# Patient Record
Sex: Female | Born: 1980 | Race: Black or African American | Hispanic: No | Marital: Married
Health system: Southern US, Community
[De-identification: ages and names within clinical notes are randomized; demographics above are authoritative.]

## PROBLEM LIST (undated history)

## (undated) DIAGNOSIS — I10 Essential (primary) hypertension: Secondary | ICD-10-CM

## (undated) DIAGNOSIS — F4310 Post-traumatic stress disorder, unspecified: Secondary | ICD-10-CM

---

## 2013-07-09 ENCOUNTER — Ambulatory Visit: Payer: Self-pay | Admitting: Adult Health

## 2013-10-08 ENCOUNTER — Ambulatory Visit: Payer: Self-pay

## 2013-10-08 LAB — URINALYSIS, COMPLETE
Bilirubin,UR: NEGATIVE
Glucose,UR: NEGATIVE
Ketone: NEGATIVE
Leukocyte Esterase: NEGATIVE
NITRITE: NEGATIVE
Ph: 7.5 (ref 5.0–8.0)
Protein: 30
Specific Gravity: 1.02 (ref 1.000–1.030)

## 2013-10-08 LAB — PREGNANCY, URINE: PREGNANCY TEST, URINE: NEGATIVE m[IU]/mL

## 2013-11-26 ENCOUNTER — Emergency Department: Payer: Self-pay | Admitting: Emergency Medicine

## 2013-11-26 LAB — COMPREHENSIVE METABOLIC PANEL
ALBUMIN: 3.2 g/dL — AB (ref 3.4–5.0)
ANION GAP: 5 — AB (ref 7–16)
Alkaline Phosphatase: 61 U/L
BUN: 8 mg/dL (ref 7–18)
Bilirubin,Total: 0.4 mg/dL (ref 0.2–1.0)
CO2: 28 mmol/L (ref 21–32)
Calcium, Total: 8.5 mg/dL (ref 8.5–10.1)
Chloride: 109 mmol/L — ABNORMAL HIGH (ref 98–107)
Creatinine: 0.58 mg/dL — ABNORMAL LOW (ref 0.60–1.30)
EGFR (African American): >60
GLUCOSE: 74 mg/dL (ref 65–99)
Osmolality: 280 (ref 275–301)
Potassium: 3.7 mmol/L (ref 3.5–5.1)
SGOT(AST): 14 U/L — ABNORMAL LOW (ref 15–37)
SGPT (ALT): 22 U/L
SODIUM: 142 mmol/L (ref 136–145)
Total Protein: 7.2 g/dL (ref 6.4–8.2)

## 2013-11-26 LAB — CBC WITH DIFFERENTIAL/PLATELET
BASOS PCT: 0.6 %
Basophil #: 0 10*3/uL (ref 0.0–0.1)
EOS PCT: 1.6 %
Eosinophil #: 0.1 10*3/uL (ref 0.0–0.7)
HCT: 39.5 % (ref 35.0–47.0)
HGB: 12.6 g/dL (ref 12.0–16.0)
LYMPHS ABS: 2.5 10*3/uL (ref 1.0–3.6)
Lymphocyte %: 32.8 %
MCH: 28.1 pg (ref 26.0–34.0)
MCHC: 31.8 g/dL — ABNORMAL LOW (ref 32.0–36.0)
MCV: 88 fL (ref 80–100)
Monocyte #: 0.4 x10 3/mm (ref 0.2–0.9)
Monocyte %: 5.6 %
Neutrophil #: 4.5 10*3/uL (ref 1.4–6.5)
Neutrophil %: 59.4 %
Platelet: 192 10*3/uL (ref 150–440)
RBC: 4.48 10*6/uL (ref 3.80–5.20)
RDW: 14.4 % (ref 11.5–14.5)
WBC: 7.6 10*3/uL (ref 3.6–11.0)

## 2013-11-26 LAB — URINALYSIS, COMPLETE
BILIRUBIN, UR: NEGATIVE
Bacteria: NONE SEEN
GLUCOSE, UR: NEGATIVE mg/dL (ref 0–75)
Ketone: NEGATIVE
LEUKOCYTE ESTERASE: NEGATIVE
Nitrite: NEGATIVE
PH: 6 (ref 4.5–8.0)
PROTEIN: NEGATIVE
RBC,UR: NONE SEEN /HPF (ref 0–5)
Specific Gravity: 1.006 (ref 1.003–1.030)
WBC UR: 6 /HPF (ref 0–5)

## 2013-11-26 LAB — TSH: Thyroid Stimulating Horm: 0.958 u[IU]/mL

## 2013-11-26 LAB — PREGNANCY, URINE: PREGNANCY TEST, URINE: NEGATIVE m[IU]/mL

## 2014-02-02 ENCOUNTER — Emergency Department: Payer: Self-pay | Admitting: Emergency Medicine

## 2014-02-02 LAB — CBC WITH DIFFERENTIAL/PLATELET
Basophil #: 0.1 10*3/uL (ref 0.0–0.1)
Basophil %: 0.8 %
EOS ABS: 0.1 10*3/uL (ref 0.0–0.7)
EOS PCT: 1 %
HCT: 44 % (ref 35.0–47.0)
HGB: 14.6 g/dL (ref 12.0–16.0)
Lymphocyte #: 2.2 10*3/uL (ref 1.0–3.6)
Lymphocyte %: 22.3 %
MCH: 28.9 pg (ref 26.0–34.0)
MCHC: 33.2 g/dL (ref 32.0–36.0)
MCV: 87 fL (ref 80–100)
MONOS PCT: 5.9 %
Monocyte #: 0.6 x10 3/mm (ref 0.2–0.9)
NEUTROS ABS: 7 10*3/uL — AB (ref 1.4–6.5)
Neutrophil %: 70 %
Platelet: 232 10*3/uL (ref 150–440)
RBC: 5.07 10*6/uL (ref 3.80–5.20)
RDW: 13.9 % (ref 11.5–14.5)
WBC: 9.9 10*3/uL (ref 3.6–11.0)

## 2014-02-02 LAB — BASIC METABOLIC PANEL
ANION GAP: 9 (ref 7–16)
BUN: 8 mg/dL (ref 7–18)
Calcium, Total: 8.4 mg/dL — ABNORMAL LOW (ref 8.5–10.1)
Chloride: 103 mmol/L (ref 98–107)
Co2: 27 mmol/L (ref 21–32)
Creatinine: 0.71 mg/dL (ref 0.60–1.30)
EGFR (Non-African Amer.): >60
GLUCOSE: 95 mg/dL (ref 65–99)
Osmolality: 276 (ref 275–301)
Potassium: 3.3 mmol/L — ABNORMAL LOW (ref 3.5–5.1)
Sodium: 139 mmol/L (ref 136–145)

## 2014-02-02 LAB — URINALYSIS, COMPLETE
Bilirubin,UR: NEGATIVE
Glucose,UR: NEGATIVE mg/dL
Ketone: NEGATIVE
Nitrite: NEGATIVE
Ph: 7
Protein: NEGATIVE
RBC,UR: 2 /HPF
Specific Gravity: 1.01
Squamous Epithelial: 4
WBC UR: 2 /HPF

## 2014-02-02 LAB — TSH: Thyroid Stimulating Horm: 0.863 u[IU]/mL

## 2014-04-13 ENCOUNTER — Emergency Department (HOSPITAL_COMMUNITY)
Admission: EM | Admit: 2014-04-13 | Discharge: 2014-04-13 | Disposition: A | Discharge status: Home / Self Care | Payer: Self-pay | Attending: Emergency Medicine | Admitting: Emergency Medicine

## 2014-04-13 ENCOUNTER — Emergency Department (HOSPITAL_COMMUNITY): Payer: Self-pay

## 2014-04-13 ENCOUNTER — Encounter (HOSPITAL_COMMUNITY): Payer: Self-pay | Admitting: Adult Health

## 2014-04-13 DIAGNOSIS — B372 Candidiasis of skin and nail: Secondary | ICD-10-CM | POA: Insufficient documentation

## 2014-04-13 DIAGNOSIS — N898 Other specified noninflammatory disorders of vagina: Secondary | ICD-10-CM | POA: Insufficient documentation

## 2014-04-13 DIAGNOSIS — I10 Essential (primary) hypertension: Secondary | ICD-10-CM | POA: Insufficient documentation

## 2014-04-13 DIAGNOSIS — E876 Hypokalemia: Secondary | ICD-10-CM | POA: Insufficient documentation

## 2014-04-13 DIAGNOSIS — Z3202 Encounter for pregnancy test, result negative: Secondary | ICD-10-CM | POA: Insufficient documentation

## 2014-04-13 DIAGNOSIS — K5900 Constipation, unspecified: Secondary | ICD-10-CM | POA: Insufficient documentation

## 2014-04-13 DIAGNOSIS — R51 Headache: Secondary | ICD-10-CM | POA: Insufficient documentation

## 2014-04-13 DIAGNOSIS — Z8659 Personal history of other mental and behavioral disorders: Secondary | ICD-10-CM | POA: Insufficient documentation

## 2014-04-13 DIAGNOSIS — N390 Urinary tract infection, site not specified: Secondary | ICD-10-CM | POA: Insufficient documentation

## 2014-04-13 DIAGNOSIS — R519 Headache, unspecified: Secondary | ICD-10-CM

## 2014-04-13 HISTORY — DX: Post-traumatic stress disorder, unspecified: F43.10

## 2014-04-13 HISTORY — DX: Essential (primary) hypertension: I10

## 2014-04-13 LAB — CBC WITH DIFFERENTIAL/PLATELET
Basophils Absolute: 0 10*3/uL (ref 0.0–0.1)
Basophils Relative: 0 % (ref 0–1)
Eosinophils Absolute: 0.1 10*3/uL (ref 0.0–0.7)
Eosinophils Relative: 1 % (ref 0–5)
HCT: 39.3 % (ref 36.0–46.0)
Hemoglobin: 13.5 g/dL (ref 12.0–15.0)
LYMPHS PCT: 35 % (ref 12–46)
Lymphs Abs: 3.3 10*3/uL (ref 0.7–4.0)
MCH: 28.5 pg (ref 26.0–34.0)
MCHC: 34.4 g/dL (ref 30.0–36.0)
MCV: 83.1 fL (ref 78.0–100.0)
Monocytes Absolute: 0.6 10*3/uL (ref 0.1–1.0)
Monocytes Relative: 7 % (ref 3–12)
NEUTROS PCT: 57 % (ref 43–77)
Neutro Abs: 5.3 10*3/uL (ref 1.7–7.7)
PLATELETS: 213 10*3/uL (ref 150–400)
RBC: 4.73 MIL/uL (ref 3.87–5.11)
RDW: 13.7 % (ref 11.5–15.5)
WBC: 9.4 10*3/uL (ref 4.0–10.5)

## 2014-04-13 LAB — COMPREHENSIVE METABOLIC PANEL
ALT: 16 U/L (ref 0–35)
ANION GAP: 8 (ref 5–15)
AST: 19 U/L (ref 0–37)
Albumin: 3.4 g/dL — ABNORMAL LOW (ref 3.5–5.2)
Alkaline Phosphatase: 61 U/L (ref 39–117)
BUN: 7 mg/dL (ref 6–23)
CO2: 27 mmol/L (ref 19–32)
Calcium: 8.7 mg/dL (ref 8.4–10.5)
Chloride: 102 mmol/L (ref 96–112)
Creatinine, Ser: 0.63 mg/dL (ref 0.50–1.10)
GFR calc Af Amer: >90 mL/min (ref >90)
Glucose, Bld: 105 mg/dL — ABNORMAL HIGH (ref 70–99)
Potassium: 2.8 mmol/L — ABNORMAL LOW (ref 3.5–5.1)
SODIUM: 137 mmol/L (ref 135–145)
Total Bilirubin: 0.7 mg/dL (ref 0.3–1.2)
Total Protein: 6.8 g/dL (ref 6.0–8.3)

## 2014-04-13 LAB — URINALYSIS, ROUTINE W REFLEX MICROSCOPIC
Bilirubin Urine: NEGATIVE
GLUCOSE, UA: NEGATIVE mg/dL
Ketones, ur: NEGATIVE mg/dL
LEUKOCYTES UA: NEGATIVE
Nitrite: NEGATIVE
PH: 7 (ref 5.0–8.0)
Protein, ur: NEGATIVE mg/dL
Specific Gravity, Urine: 1.016 (ref 1.005–1.030)
Urobilinogen, UA: 1 mg/dL (ref 0.0–1.0)

## 2014-04-13 LAB — URINE MICROSCOPIC-ADD ON

## 2014-04-13 LAB — LIPASE, BLOOD: LIPASE: 27 U/L (ref 11–59)

## 2014-04-13 LAB — WET PREP, GENITAL
Trich, Wet Prep: NONE SEEN
Yeast Wet Prep HPF POC: NONE SEEN

## 2014-04-13 LAB — POC URINE PREG, ED: Preg Test, Ur: NEGATIVE

## 2014-04-13 MED ORDER — HYDROCORTISONE 1 % EX CREA: TOPICAL_CREAM | CUTANEOUS | Status: AC

## 2014-04-13 MED ORDER — KETOROLAC TROMETHAMINE 30 MG/ML IJ SOLN: 30.0000 mg | Freq: Once | INTRAMUSCULAR | Status: DC

## 2014-04-13 MED ORDER — CLOTRIMAZOLE 1 % EX CREA: TOPICAL_CREAM | CUTANEOUS | Status: AC

## 2014-04-13 MED ORDER — DIPHENHYDRAMINE HCL 50 MG/ML IJ SOLN: 25.0000 mg | Freq: Once | INTRAMUSCULAR | Status: DC

## 2014-04-13 MED ORDER — METOCLOPRAMIDE HCL 5 MG/ML IJ SOLN: 10.0000 mg | Freq: Once | INTRAMUSCULAR | Status: DC

## 2014-04-13 MED ORDER — POLYETHYLENE GLYCOL 3350 17 G PO PACK: 17.0000 g | PACK | Freq: Every day | ORAL | Status: AC

## 2014-04-13 MED ORDER — SODIUM CHLORIDE 0.9 % IV BOLUS (SEPSIS): 1000.0000 mL | Freq: Once | INTRAVENOUS | Status: DC

## 2014-04-13 MED ORDER — DICYCLOMINE HCL 20 MG PO TABS: 20.0000 mg | ORAL_TABLET | Freq: Two times a day (BID) | ORAL | Status: AC

## 2014-04-13 MED ORDER — METOCLOPRAMIDE HCL 10 MG PO TABS
10.0000 mg | ORAL_TABLET | Freq: Once | ORAL | Status: DC
  Filled 2014-04-13: qty 1

## 2014-04-13 MED ORDER — ACETAMINOPHEN 500 MG PO TABS
1000.0000 mg | ORAL_TABLET | Freq: Once | ORAL | Status: DC
  Filled 2014-04-13: qty 2

## 2014-04-13 MED ORDER — POTASSIUM CHLORIDE CRYS ER 20 MEQ PO TBCR
40.0000 meq | EXTENDED_RELEASE_TABLET | Freq: Once | ORAL | Status: AC
  Administered 2014-04-13: 40 meq via ORAL
  Filled 2014-04-13: qty 2

## 2014-04-13 MED ORDER — CEPHALEXIN 500 MG PO CAPS: 500.0000 mg | ORAL_CAPSULE | Freq: Three times a day (TID) | ORAL | Status: AC

## 2014-04-13 MED ORDER — DIPHENHYDRAMINE HCL 25 MG PO CAPS
25.0000 mg | ORAL_CAPSULE | Freq: Once | ORAL | Status: DC
  Filled 2014-04-13: qty 1

## 2014-04-13 NOTE — ED Notes (Signed)
Pt updated on wait time.  

## 2014-04-13 NOTE — ED Provider Notes (Signed)
CSN: [id number]     Arrival date & time 04/13/14  1559 History   First MD Initiated Contact with Patient 04/13/14 1921     Chief Complaint  Patient presents with  . Headache  . Abdominal Pain   HPI  Patient is a 34 year old female with past medical history hypertension who presents emergency room for evaluation of multiple complaints. Complaint #1 is that she has chronic headaches. Patient states that she intermittently experiences frontal throbbing headaches which feel like her head is in a vice grip. She is unaware of any aggravating factors. They normally go away with some anti-inflammatory use. She sometimes gets off before her eyes. There is a strong family history of migraines in her mother and her sister. Complaint #2 is that she is having generalized abdominal pain. Patient states that she has constipation and sometimes goes as infrequently as once per 2 weeks. Patient has tried stool softeners at home with little relief and also Gas-X. She also complains of foul smelling gas and some crampy feelings. Her last bowel movement was 4 days ago. She has the urge to defecate but cannot go. Patient states there is a strong family history of IBS as well. Patient also concern for possible bacterial vaginosis. She states that she has had some vaginal discharge and dysuria. She states that she frequently has this problem. Patient's last concern is that she has a rash on her chest. The rash is underneath her breasts. She states that is burning. She states she has been swimming frequently. She is requesting that her thyroid and her HIV be tested today.  Past Medical History  Diagnosis Date  . PTSD (post-traumatic stress disorder)   . Hypertension    Past Surgical History  Procedure Laterality Date  . Cesarean section     History reviewed. No pertinent family history. History  Substance Use Topics  . Smoking status: Not on file  . Smokeless tobacco: Not on file  . Alcohol Use: No   OB History     No data available     Review of Systems  Constitutional: Negative for fever, chills and fatigue.  Respiratory: Negative for chest tightness and shortness of breath.   Cardiovascular: Negative for chest pain and palpitations.  Gastrointestinal: Positive for abdominal pain and constipation. Negative for nausea, vomiting, diarrhea and abdominal distention.  Genitourinary: Positive for dysuria and vaginal discharge. Negative for urgency, frequency, hematuria, decreased urine volume, vaginal bleeding, difficulty urinating and vaginal pain.  Musculoskeletal: Negative for neck pain and neck stiffness.  Neurological: Positive for headaches. Negative for dizziness, syncope, speech difficulty, weakness and numbness.  All other systems reviewed and are negative.     Allergies  Other  Home Medications   Prior to Admission medications   Medication Sig Start Date End Date Taking? Authorizing Provider  cephALEXin (KEFLEX) 500 MG capsule Take 1 capsule (500 mg total) by mouth 3 (three) times daily. 04/13/14   Denni France A Forcucci, PA-C  clotrimazole (LOTRIMIN) 1 % cream Apply to affected area 2 times daily 04/13/14   Adolpho Meenach A Forcucci, PA-C  dicyclomine (BENTYL) 20 MG tablet Take 1 tablet (20 mg total) by mouth 2 (two) times daily. 04/13/14   Lealer Marsland A Forcucci, PA-C  hydrocortisone cream 1 % Apply to affected area 2 times daily 04/13/14   Ruthella Kirchman A Forcucci, PA-C  polyethylene glycol (MIRALAX / GLYCOLAX) packet Take 17 g by mouth daily. 04/13/14   Kinda Pottle A Forcucci, PA-C   BP 114/64 mmHg  Pulse 79  Temp(Src)  98.2 F (36.8 C) (Oral)  Resp 18  Ht  (1.651 m)  Wt 200 lb (90.719 kg)  BMI 33.28 kg/m2  SpO2 100%  LMP 02/26/2014 (Approximate) Physical Exam  Constitutional: She is oriented to person, place, and time. She appears well-developed and well-nourished. No distress.  HENT:  Head: Normocephalic and atraumatic.  Mouth/Throat: Oropharynx is clear and moist. No oropharyngeal exudate.   Eyes: Conjunctivae and EOM are normal. Pupils are equal, round, and reactive to light. No scleral icterus.  Neck: Normal range of motion. Neck supple. No JVD present. No Brudzinski's sign and no Kernig's sign noted. No thyromegaly present.  Cardiovascular: Normal rate, regular rhythm and intact distal pulses.  Exam reveals no gallop and no friction rub.   No murmur heard. Pulmonary/Chest: Effort normal and breath sounds normal. No respiratory distress. She has no wheezes. She has no rales. She exhibits no tenderness.  Abdominal: Soft. Normal appearance and bowel sounds are normal. She exhibits no distension and no mass. There is generalized tenderness. There is no rigidity, no rebound, no guarding, no tenderness at McBurney's point and negative Murphy's sign.  Musculoskeletal: Normal range of motion.  Lymphadenopathy:    She has no cervical adenopathy.  Neurological: She is alert and oriented to person, place, and time.  Skin: Skin is warm and dry. She is not diaphoretic.  Psychiatric: She has a normal mood and affect. Her behavior is normal. Judgment and thought content normal.  Nursing note and vitals reviewed.   ED Course  Procedures (including critical care time) Labs Review Labs Reviewed  WET PREP, GENITAL - Abnormal; Notable for the following:    Clue Cells Wet Prep HPF POC FEW (*)    WBC, Wet Prep HPF POC FEW (*)    All other components within normal limits  COMPREHENSIVE METABOLIC PANEL - Abnormal; Notable for the following:    Potassium 2.8 (*)    Glucose, Bld 105 (*)    Albumin 3.4 (*)    All other components within normal limits  URINALYSIS, ROUTINE W REFLEX MICROSCOPIC - Abnormal; Notable for the following:    APPearance CLOUDY (*)    Hgb urine dipstick LARGE (*)    All other components within normal limits  URINE MICROSCOPIC-ADD ON - Abnormal; Notable for the following:    Squamous Epithelial / LPF FEW (*)    Bacteria, UA MANY (*)    All other components within  normal limits  URINE CULTURE  CBC WITH DIFFERENTIAL/PLATELET  LIPASE, BLOOD  HIV ANTIBODY (ROUTINE TESTING)  POC URINE PREG, ED  GC/CHLAMYDIA PROBE AMP ([place])    Imaging Review Dg Abd 1 View  04/13/2014   CLINICAL DATA:  Abdominal pain, worse with urinating or bowel movements for 2 days.  EXAM: ABDOMEN - 1 VIEW  COMPARISON:  None.  FINDINGS: The bowel gas pattern is normal. No radio-opaque calculi or other significant radiographic abnormality are seen. IUD is noted.  IMPRESSION: No acute finding.   Electronically Signed   By: Drusilla Kanner M.D.   On: 04/13/2014 21:22     EKG Interpretation None      MDM   Final diagnoses:  UTI (lower urinary tract infection)  Constipation, unspecified constipation type  Nonintractable headache, unspecified chronicity pattern, unspecified headache type  Candidal skin infection   Patient is a 34 year old female presents emergency room for evaluation of multiple complaints. Physical exam is unremarkable. Abdominal is generalized tender with no rigidity, guarding, or peritoneal signs. Patient has a normal neurological exam.  Patient treated here with migraine cocktail orally. Basic lab work is unremarkable other than mild hypokalemia which has been replaced orally here. Urinalysis is leukocyte and nitrite negative does appear to have red blood cells and large bacteria. Prep shows few clue cells but no frank bacterial vaginosis. CBC, lipase are unremarkable. GC is pending. Urine pregnancy is negative. We'll refer the patient to neurology for further care and trying to prevent headaches. We'll discharge the patient home on MiraLAX for constipation. This is a clinical diagnosis of abdominal x-ray is unremarkable. We'll discharge home on Keflex for possible urinary tract infection. We'll send urine for culture. Patient to return for difficulty urinating, intractable abdominal pain, or any other concerning symptoms. Patient states understanding and  agreement at this time. We'll refer the patient to the [facility] and will also give the resource list. She does need to set up with a primary care provider. HIV is pending. Patient states understanding and agreement at this time. Patient stable for discharge.    Eben Burow, PA-C 04/13/14 2310  Geoffery Lyons, MD 04/17/14 [phone]

## 2014-04-13 NOTE — Discharge Instructions (Signed)
Headaches, Frequently Asked Questions °MIGRAINE HEADACHES °Q: What is migraine? What causes it? How can I treat it? °A: Generally, migraine headaches begin as a dull ache. Then they develop into a constant, throbbing, and pulsating pain. You may experience pain at the temples. You may experience pain at the front or back of one or both sides of the head. The pain is usually accompanied by a combination of: °· Nausea. °· Vomiting. °· Sensitivity to light and noise. °Some people (about 15%) experience an aura (see below) before an attack. The cause of migraine is believed to be chemical reactions in the brain. Treatment for migraine may include over-the-counter or prescription medications. It may also include self-help techniques. These include relaxation training and biofeedback.  °Q: What is an aura? °A: About 15% of people with migraine get an "aura". This is a sign of neurological symptoms that occur before a migraine headache. You may see wavy or jagged lines, dots, or flashing lights. You might experience tunnel vision or blind spots in one or both eyes. The aura can include visual or auditory hallucinations (something imagined). It may include disruptions in smell (such as strange odors), taste or touch. Other symptoms include: °· Numbness. °· A "pins and needles" sensation. °· Difficulty in recalling or speaking the correct word. °These neurological events may last as long as 60 minutes. These symptoms will fade as the headache begins. °Q: What is a trigger? °A: Certain physical or environmental factors can lead to or "trigger" a migraine. These include: °· Foods. °· Hormonal changes. °· Weather. °· Stress. °It is important to remember that triggers are different for everyone. To help prevent migraine attacks, you need to figure out which triggers affect you. Keep a headache diary. This is a good way to track triggers. The diary will help you talk to your healthcare professional about your condition. °Q: Does  weather affect migraines? °A: Bright sunshine, hot, humid conditions, and drastic changes in barometric pressure may lead to, or "trigger," a migraine attack in some people. But studies have shown that weather does not act as a trigger for everyone with migraines. °Q: What is the link between migraine and hormones? °A: Hormones start and regulate many of your body's functions. Hormones keep your body in balance within a constantly changing environment. The levels of hormones in your body are unbalanced at times. Examples are during menstruation, pregnancy, or menopause. That can lead to a migraine attack. In fact, about three quarters of all women with migraine report that their attacks are related to the menstrual cycle.  °Q: Is there an increased risk of stroke for migraine sufferers? °A: The likelihood of a migraine attack causing a stroke is very remote. That is not to say that migraine sufferers cannot have a stroke associated with their migraines. In persons under age 40, the most common associated factor for stroke is migraine headache. But over the course of a person's normal life span, the occurrence of migraine headache may actually be associated with a reduced risk of dying from cerebrovascular disease due to stroke.  °Q: What are acute medications for migraine? °A: Acute medications are used to treat the pain of the headache after it has started. Examples over-the-counter medications, NSAIDs, ergots, and triptans.  °Q: What are the triptans? °A: Triptans are the newest class of abortive medications. They are specifically targeted to treat migraine. Triptans are vasoconstrictors. They moderate some chemical reactions in the brain. The triptans work on receptors in your brain. Triptans help   to restore the balance of a neurotransmitter called serotonin. Fluctuations in levels of serotonin are thought to be a main cause of migraine.  °Q: Are over-the-counter medications for migraine effective? °A:  Over-the-counter, or "OTC," medications may be effective in relieving mild to moderate pain and associated symptoms of migraine. But you should see your caregiver before beginning any treatment regimen for migraine.  °Q: What are preventive medications for migraine? °A: Preventive medications for migraine are sometimes referred to as "prophylactic" treatments. They are used to reduce the frequency, severity, and length of migraine attacks. Examples of preventive medications include antiepileptic medications, antidepressants, beta-blockers, calcium channel blockers, and NSAIDs (nonsteroidal anti-inflammatory drugs). °Q: Why are anticonvulsants used to treat migraine? °A: During the past few years, there has been an increased interest in antiepileptic drugs for the prevention of migraine. They are sometimes referred to as "anticonvulsants". Both epilepsy and migraine may be caused by similar reactions in the brain.  °Q: Why are antidepressants used to treat migraine? °A: Antidepressants are typically used to treat people with depression. They may reduce migraine frequency by regulating chemical levels, such as serotonin, in the brain.  °Q: What alternative therapies are used to treat migraine? °A: The term "alternative therapies" is often used to describe treatments considered outside the scope of conventional Western medicine. Examples of alternative therapy include acupuncture, acupressure, and yoga. Another common alternative treatment is herbal therapy. Some herbs are believed to relieve headache pain. Always discuss alternative therapies with your caregiver before proceeding. Some herbal products contain arsenic and other toxins. °TENSION HEADACHES °Q: What is a tension-type headache? What causes it? How can I treat it? °A: Tension-type headaches occur randomly. They are often the result of temporary stress, anxiety, fatigue, or anger. Symptoms include soreness in your temples, a tightening band-like sensation  around your head (a "vice-like" ache). Symptoms can also include a pulling feeling, pressure sensations, and contracting head and neck muscles. The headache begins in your forehead, temples, or the back of your head and neck. Treatment for tension-type headache may include over-the-counter or prescription medications. Treatment may also include self-help techniques such as relaxation training and biofeedback. °CLUSTER HEADACHES °Q: What is a cluster headache? What causes it? How can I treat it? °A: Cluster headache gets its name because the attacks come in groups. The pain arrives with little, if any, warning. It is usually on one side of the head. A tearing or bloodshot eye and a runny nose on the same side of the headache may also accompany the pain. Cluster headaches are believed to be caused by chemical reactions in the brain. They have been described as the most severe and intense of any headache type. Treatment for cluster headache includes prescription medication and oxygen. °SINUS HEADACHES °Q: What is a sinus headache? What causes it? How can I treat it? °A: When a cavity in the bones of the face and skull (a sinus) becomes inflamed, the inflammation will cause localized pain. This condition is usually the result of an allergic reaction, a tumor, or an infection. If your headache is caused by a sinus blockage, such as an infection, you will probably have a fever. An x-ray will confirm a sinus blockage. Your caregiver's treatment might include antibiotics for the infection, as well as antihistamines or decongestants.  °REBOUND HEADACHES °Q: What is a rebound headache? What causes it? How can I treat it? °A: A pattern of taking acute headache medications too often can lead to a condition known as "rebound headache."   A pattern of taking too much headache medication includes taking it more than 2 days per week or in excessive amounts. That means more than the label or a caregiver advises. With rebound  headaches, your medications not only stop relieving pain, they actually begin to cause headaches. Doctors treat rebound headache by tapering the medication that is being overused. Sometimes your caregiver will gradually substitute a different type of treatment or medication. Stopping may be a challenge. Regularly overusing a medication increases the potential for serious side effects. Consult a caregiver if you regularly use headache medications more than 2 days per week or more than the label advises. ADDITIONAL QUESTIONS AND ANSWERS Q: What is biofeedback? A: Biofeedback is a self-help treatment. Biofeedback uses special equipment to monitor your body's involuntary physical responses. Biofeedback monitors:  Breathing.  Pulse.  Heart rate.  Temperature.  Muscle tension.  Brain activity. Biofeedback helps you refine and perfect your relaxation exercises. You learn to control the physical responses that are related to stress. Once the technique has been mastered, you do not need the equipment any more. Q: Are headaches hereditary? A: Four out of five (80%) of people that suffer report a family history of migraine. Scientists are not sure if this is genetic or a family predisposition. Despite the uncertainty, a child has a 50% chance of having migraine if one parent suffers. The child has a 75% chance if both parents suffer.  Q: Can children get headaches? A: By the time they reach high school, most young people have experienced some type of headache. Many safe and effective approaches or medications can prevent a headache from occurring or stop it after it has begun.  Q: What type of doctor should I see to diagnose and treat my headache? A: Start with your primary caregiver. Discuss his or her experience and approach to headaches. Discuss methods of classification, diagnosis, and treatment. Your caregiver may decide to recommend you to a headache specialist, depending upon your symptoms or other  physical conditions. Having diabetes, allergies, etc., may require a more comprehensive and inclusive approach to your headache. The National Headache Foundation will provide, upon request, a list of [facility] physician members in your state. Document Released: 05/05/2003 Document Revised: 05/07/2011 Document Reviewed: 10/13/2007 [facility] Patient Information 2015 [place]. This information is not intended to replace advice given to you by your health care provider. Make sure you discuss any questions you have with your health care provider.  Constipation Constipation is when a person has fewer than three bowel movements a week, has difficulty having a bowel movement, or has stools that are dry, hard, or larger than normal. As people grow older, constipation is more common. If you try to fix constipation with medicines that make you have a bowel movement (laxatives), the problem may get worse. Long-term laxative use may cause the muscles of the colon to become weak. A low-fiber diet, not taking in enough fluids, and taking certain medicines may make constipation worse.  CAUSES   Certain medicines, such as antidepressants, pain medicine, iron supplements, antacids, and water pills.   Certain diseases, such as diabetes, irritable bowel syndrome (IBS), thyroid disease, or depression.   Not drinking enough water.   Not eating enough fiber-rich foods.   Stress or travel.   Lack of physical activity or exercise.   Ignoring the urge to have a bowel movement.   Using laxatives too much.  SIGNS AND SYMPTOMS   Having fewer than three bowel movements a week.  Straining to have a bowel movement.   Having stools that are hard, dry, or larger than normal.   Feeling full or bloated.   Pain in the lower abdomen.   Not feeling relief after having a bowel movement.  DIAGNOSIS  Your health care provider will take a medical history and perform a physical exam. Further testing may be  done for severe constipation. Some tests may include:  A barium enema X-ray to examine your rectum, colon, and, sometimes, your small intestine.   A sigmoidoscopy to examine your lower colon.   A colonoscopy to examine your entire colon. TREATMENT  Treatment will depend on the severity of your constipation and what is causing it. Some dietary treatments include drinking more fluids and eating more fiber-rich foods. Lifestyle treatments may include regular exercise. If these diet and lifestyle recommendations do not help, your health care provider may recommend taking over-the-counter laxative medicines to help you have bowel movements. Prescription medicines may be prescribed if over-the-counter medicines do not work.  HOME CARE INSTRUCTIONS   Eat foods that have a lot of fiber, such as fruits, vegetables, whole grains, and beans.  Limit foods high in fat and processed sugars, such as french fries, hamburgers, cookies, candies, and soda.   A fiber supplement may be added to your diet if you cannot get enough fiber from foods.   Drink enough fluids to keep your urine clear or pale yellow.   Exercise regularly or as directed by your health care provider.   Go to the restroom when you have the urge to go. Do not hold it.   Only take over-the-counter or prescription medicines as directed by your health care provider. Do not take other medicines for constipation without talking to your health care provider first.  SEEK IMMEDIATE MEDICAL CARE IF:   You have bright red blood in your stool.   Your constipation lasts for more than 4 days or gets worse.   You have abdominal or rectal pain.   You have thin, pencil-like stools.   You have unexplained weight loss. MAKE SURE YOU:   Understand these instructions.  Will watch your condition.  Will get help right away if you are not doing well or get worse. Document Released: 11/11/2003 Document Revised: 02/17/2013 Document  Reviewed: 11/24/2012 [facility] Patient Information 2015 [place], [place]. This information is not intended to replace advice given to you by your health care provider. Make sure you discuss any questions you have with your health care provider.  Urinary Tract Infection Urinary tract infections (UTIs) can develop anywhere along your urinary tract. Your urinary tract is your body's drainage system for removing wastes and extra water. Your urinary tract includes two kidneys, two ureters, a bladder, and a urethra. Your kidneys are a pair of bean-shaped organs. Each kidney is about the size of your fist. They are located below your ribs, one on each side of your spine. CAUSES Infections are caused by microbes, which are microscopic organisms, including fungi, viruses, and bacteria. These organisms are so small that they can only be seen through a microscope. Bacteria are the microbes that most commonly cause UTIs. SYMPTOMS  Symptoms of UTIs may vary by age and gender of the patient and by the location of the infection. Symptoms in young women typically include a frequent and intense urge to urinate and a painful, burning feeling in the bladder or urethra during urination. Older women and men are more likely to be tired, shaky, and weak and have  muscle aches and abdominal pain. A fever may mean the infection is in your kidneys. Other symptoms of a kidney infection include pain in your back or sides below the ribs, nausea, and vomiting. DIAGNOSIS To diagnose a UTI, your caregiver will ask you about your symptoms. Your caregiver also will ask to provide a urine sample. The urine sample will be tested for bacteria and white blood cells. White blood cells are made by your body to help fight infection. TREATMENT  Typically, UTIs can be treated with medication. Because most UTIs are caused by a bacterial infection, they usually can be treated with the use of antibiotics. The choice of antibiotic and length of  treatment depend on your symptoms and the type of bacteria causing your infection. HOME CARE INSTRUCTIONS  If you were prescribed antibiotics, take them exactly as your caregiver instructs you. Finish the medication even if you feel better after you have only taken some of the medication.  Drink enough water and fluids to keep your urine clear or pale yellow.  Avoid caffeine, tea, and carbonated beverages. They tend to irritate your bladder.  Empty your bladder often. Avoid holding urine for long periods of time.  Empty your bladder before and after sexual intercourse.  After a bowel movement, women should cleanse from front to back. Use each tissue only once. SEEK MEDICAL CARE IF:   You have back pain.  You develop a fever.  Your symptoms do not begin to resolve within 3 days. SEEK IMMEDIATE MEDICAL CARE IF:   You have severe back pain or lower abdominal pain.  You develop chills.  You have nausea or vomiting.  You have continued burning or discomfort with urination. MAKE SURE YOU:   Understand these instructions.  Will watch your condition.  Will get help right away if you are not doing well or get worse. Document Released: 11/22/2004 Document Revised: 08/14/2011 Document Reviewed: 03/23/2011 [facility] Patient Information 2015 [place], [place]. This information is not intended to replace advice given to you by your health care provider. Make sure you discuss any questions you have with your health care provider.   Emergency Department Resource Guide 1) Find a Doctor and Pay Out of Pocket Although you won't have to find out who is covered by your insurance plan, it is a good idea to ask around and get recommendations. You will then need to call the office and see if the doctor you have chosen will accept you as a new patient and what types of options they offer for patients who are self-pay. Some doctors offer discounts or will set up payment plans for their patients who do  not have insurance, but you will need to ask so you aren't surprised when you get to your appointment.  2) Contact Your Local Health Department Not all health departments have doctors that can see patients for sick visits, but many do, so it is worth a call to see if yours does. If you don't know where your local health department is, you can check in your phone book. The CDC also has a tool to help you locate your state's health department, and many state websites also have listings of all of their local health departments.  3) Find a Walk-in Clinic If your illness is not likely to be very severe or complicated, you may want to try a walk in clinic. These are popping up all over the country in pharmacies, drugstores, and shopping centers. They're usually staffed by nurse practitioners or  physician assistants that have been trained to treat common illnesses and complaints. They're usually fairly quick and inexpensive. However, if you have serious medical issues or chronic medical problems, these are probably not your best option.  No Primary Care Doctor: - Call Health Connect at  [phone] - they can help you locate a primary care doctor that  accepts your insurance, provides certain services, etc. - Physician Referral Service- [phone]  Chronic Pain Problems: Organization         Address  Phone   Notes  [facility]  [phone] Patients need to be referred by their primary care doctor.   Medication Assistance: Organization         Address  Phone   Notes  [facility] Medication [facility] [address] [place] [place], [place] [address] [phone] --Must be a resident of [facility] -- Must have NO insurance coverage whatsoever (no Medicaid/ Medicare, etc.) -- The pt. MUST have a primary care doctor that directs their care regularly and follows them in the community   [facility]  [phone]   [employer]  [phone]    Agencies that  provide inexpensive medical care: Organization         Address  Phone   Notes  [facility]  [phone]   [facility]    [phone]   [facility] [address] [place], [place] [address] [phone]   [facility] [address] [place] [place] [phone]   [facility]anned Parenthood    [phone]   [facility]    [phone]   [facility]  [address], [place] Phone:  [phone], Fax:  [phone] Hours of Operation:  9 am - 6 pm, M-F.  Also accepts Medicaid/Medicare and self-pay.  [facility]  [address], [place] Phone: [phone], Fax: [phone]. Hours of Operation:  8:30 am - 5:30 pm, M-F.  Also accepts Medicaid and self-pay.  [facility] [place] [address], [place] Phone: [phone]   [facility] [address] Natasha Bence [place], [place] [phone], Ext. [phone] Mondays & Thursdays: 7-9 AM.  First 15 patients are seen on a first come, first serve basis.    Medicaid-accepting [facility] Providers:  Organization         Address  Phone   Notes  [facility] [address], [place] [phone] Also accepts self-pay patients.  [facility] [address] Laurell [place], [place]  [phone]   [facility] [address], [place] [phone]   [facility] [address] [place] [phone]   Renaye Rakers [address], [place]   [phone] Only accepts [place] patients after they have their name applied to their card.   Self-Pay (no insurance) in [facility]:  Organization         Address  Phone   Notes  Sickle Cell Patients, [facility] [address] [place], [place] [phone]   [facility]  Urgent Care [address] [place], [place] [phone]   [facility] [place]  [address], [place] [phone]   [facility]/Dr. Julio Sicks  [address] [place] [address], [place] or [address], [place] [phone] Phone number for both [place] and [place] locations is the same.  [facility] [address], [place] [phone]   [facility] [address] [place] or [address] [phone]   [facility] [address] [place] [phone], phone; [phone], fax Sees patients 1st and 3rd Saturday of every month.  Must not qualify for public or private insurance (i.e. Medicaid, Medicare, [place] Health Choice, Veterans' Benefits)  Household income should be no more than 200% of the poverty level The clinic cannot treat you if you are pregnant or think you are pregnant  Sexually transmitted diseases are not treated at the clinic.    Dental Care: Organization         Address  Phone  Notes  [facility] Department of [facility] [address] [place], [place] [phone] Accepts children up to age 16 who are enrolled in [place] or [place] Health Choice; pregnant women with a Medicaid card; and children who have applied for Medicaid or [place] Health Choice, but were declined, whose parents can pay a reduced fee at time of service.  [facility] Department of [facility]  [address], [place] [phone] Accepts children up to age 17 who are enrolled in [place] or [place] Health Choice; pregnant women with a Medicaid card; and children who have applied for Medicaid or [place] Health Choice, but were declined, whose parents can pay a reduced fee at time of service.  [facility]  [address] [place], [place] [phone] Patients are seen by appointment only. Walk-ins  are not accepted. [facility] will see patients 38 years of age and older. Monday - Tuesday (8am-5pm) Most Wednesdays (8:30-5pm) $30 per visit, cash only  [facility] [facility] Access [facility]  [address], [facility] [phone] Patients are seen by appointment only. Walk-ins are not accepted. [facility] will see patients 9 years of age and older. One Wednesday Evening (Monthly: Volunteer Based).  $30 per visit, cash only  [employer]  [phone] for adults; Children under age 48, call Graduate Pediatric Dentistry at [phone]. Children aged 69-14, please call [phone] to request a pediatric application.  Dental services are provided in all areas of dental care including fillings, crowns and bridges, complete and partial dentures, implants, gum treatment, root canals, and extractions. Preventive care is also provided. Treatment is provided to both adults and children. Patients are selected via a lottery and there is often a waiting list.   [facility] [address], [place]  [phone] [email or web address]   [facility] [address] [place], [place] [phone], Ext. [phone] Second and Fourth Thursday of each month, opens at 6:30 AM; Clinic ends at 9 AM.  Patients are seen on a first-come first-served basis, and a limited number are seen during each clinic.   [facility]  [address] [place], [place] [phone]   Eligibility Requirements You must have lived in [place], [place], or [place] counties for at least the last three months.   You cannot be eligible for state or federal sponsored [place], including [employer], [place], or [employer].   You generally cannot be eligible for healthcare insurance through your employer.    How to  apply: Eligibility screenings are held every Tuesday and Wednesday afternoon from 1:00 pm until 4:00 pm. You do not need an appointment  for the interview!  [facility] [address] [place], [place] [phone]   [facility] Health Department  [phone]   [facility] Health Department  [phone]   [facility] Health Department  [phone]    Behavioral Health Resources in the Community: Intensive Outpatient Programs Organization         Address  Phone  Notes  [facility] [address] [place], [place] [phone]   [facility] [address], [place], [place] [phone]   [facility] [address] [place], [place]  [phone]   [facility] Mental Health [address]  [place], [place] [phone] or [phone]   Substance Abuse Resources Organization         Address  Phone  Notes  [facility]  [phone]   [facility]  [phone]   [facility]  [phone]   Floydene Flock  [phone]   Residential & Outpatient Substance Abuse Program  [phone]   Psychological Services Organization         Address  Phone  Notes  [facility] Behavioral Health  [phone]   [facility]  [phone]   [facility] Mental Health [address] [place] [phone] or [phone]    Mobile Crisis Teams Organization         Address  Phone  Notes  [facility], Mobile Crisis Care Unit  [phone]   [facility]  [address] [place], [place] [phone]   Doristine Locks [address] [place] [phone]    Self-Help/Support Groups Organization         Address  Phone             Notes  [facility] [place] - variety of support groups  [phone]- I7437963 Call for more information  Narcotics Anonymous (NA), [facility] [address], [employer] [place]  2 meetings at this location   Statistician         Address  Phone  Notes  [facility] [address],    [employer] [place]  [phone]   [facility]  [address], [place] [address], [place], [place] [phone]   [facility] [address] [place], [place] [phone] Admissions: 8am-3pm M-F  [facility] [address]    [place], [place] [phone]   [facility] [address], [place], [place] [phone]   [facility] [address]  [place], [place] [phone]   [facility] - Intensive Outpatient [address], [place], [place] [phone]   [facility] ([facility] [address] [place]  [place], [place] [phone] or [phone]   [facility] ([facility]) [address], [place], [place] [phone] Accepts Medicaid  [facility] [address]  [place] [phone] Substance Abuse/Addiction Treatment   [facility] Organization         Address  Phone  Notes  [facility]  [phone]   Angie Fava, PhD [address] Ervin Knack [place], [place]   [phone] or [phone]   [facility] Behavioral   [address] [place], [place] [phone]   [facility] [address], [place],  [place] [phone] Insurance/Medicaid/sponsorship through [facility] and Families [address], [address] [place], [place] [phone] [place], [place] [phone]    Dr. Adele Schilder  [phone]   [facility] Dept. 1) [address], [place] 2) [place] 3)  [place] [address], [place] [phone]  [phone]   [place] [phone] or [phone] (After Hours)

## 2014-04-13 NOTE — ED Notes (Addendum)
Presents with 2 days of headaches and seeing spots in vision, abdominal pain that is worse when urinating or having a BM, diaphoresis and passing a lot of gas, nausea and dark colored urine, breast tenderness and rash under both breasts. Denies emesis, endorses one bout of loose stools. Pt alert, oriented, VSS. MAE x4.  Endorses excessive thirst. She was taken off Lithium on Friday-she took the medication for 2 months.

## 2014-04-13 NOTE — ED Notes (Signed)
Updated pt's spouse on wait time.  Spouse verbalized understanding.

## 2014-04-14 ENCOUNTER — Telehealth (HOSPITAL_BASED_OUTPATIENT_CLINIC_OR_DEPARTMENT_OTHER): Payer: Self-pay | Admitting: Emergency Medicine

## 2014-04-14 LAB — GC/CHLAMYDIA PROBE AMP (~~LOC~~) NOT AT ARMC
Chlamydia: NEGATIVE
NEISSERIA GONORRHEA: NEGATIVE

## 2014-04-15 ENCOUNTER — Telehealth (HOSPITAL_BASED_OUTPATIENT_CLINIC_OR_DEPARTMENT_OTHER): Payer: Self-pay | Admitting: Emergency Medicine

## 2014-04-15 LAB — HIV ANTIBODY (ROUTINE TESTING W REFLEX): HIV SCREEN 4TH GENERATION: NONREACTIVE

## 2014-04-15 LAB — URINE CULTURE

## 2014-06-09 ENCOUNTER — Emergency Department
Admit: 2014-06-09 | Disposition: A | Discharge status: Home / Self Care | Payer: Self-pay | Admitting: Emergency Medicine

## 2016-06-09 IMAGING — CR DG ABDOMEN 1V
1 series · 1 of 1 positions shown · non-contrast
Comparison: Prior abdominal radiograph 04/13/2014

CLINICAL DATA: 33-year-old female with generalized abdominal pain
and constipation.

EXAM:
ABDOMEN - 1 VIEW

[supine kub]
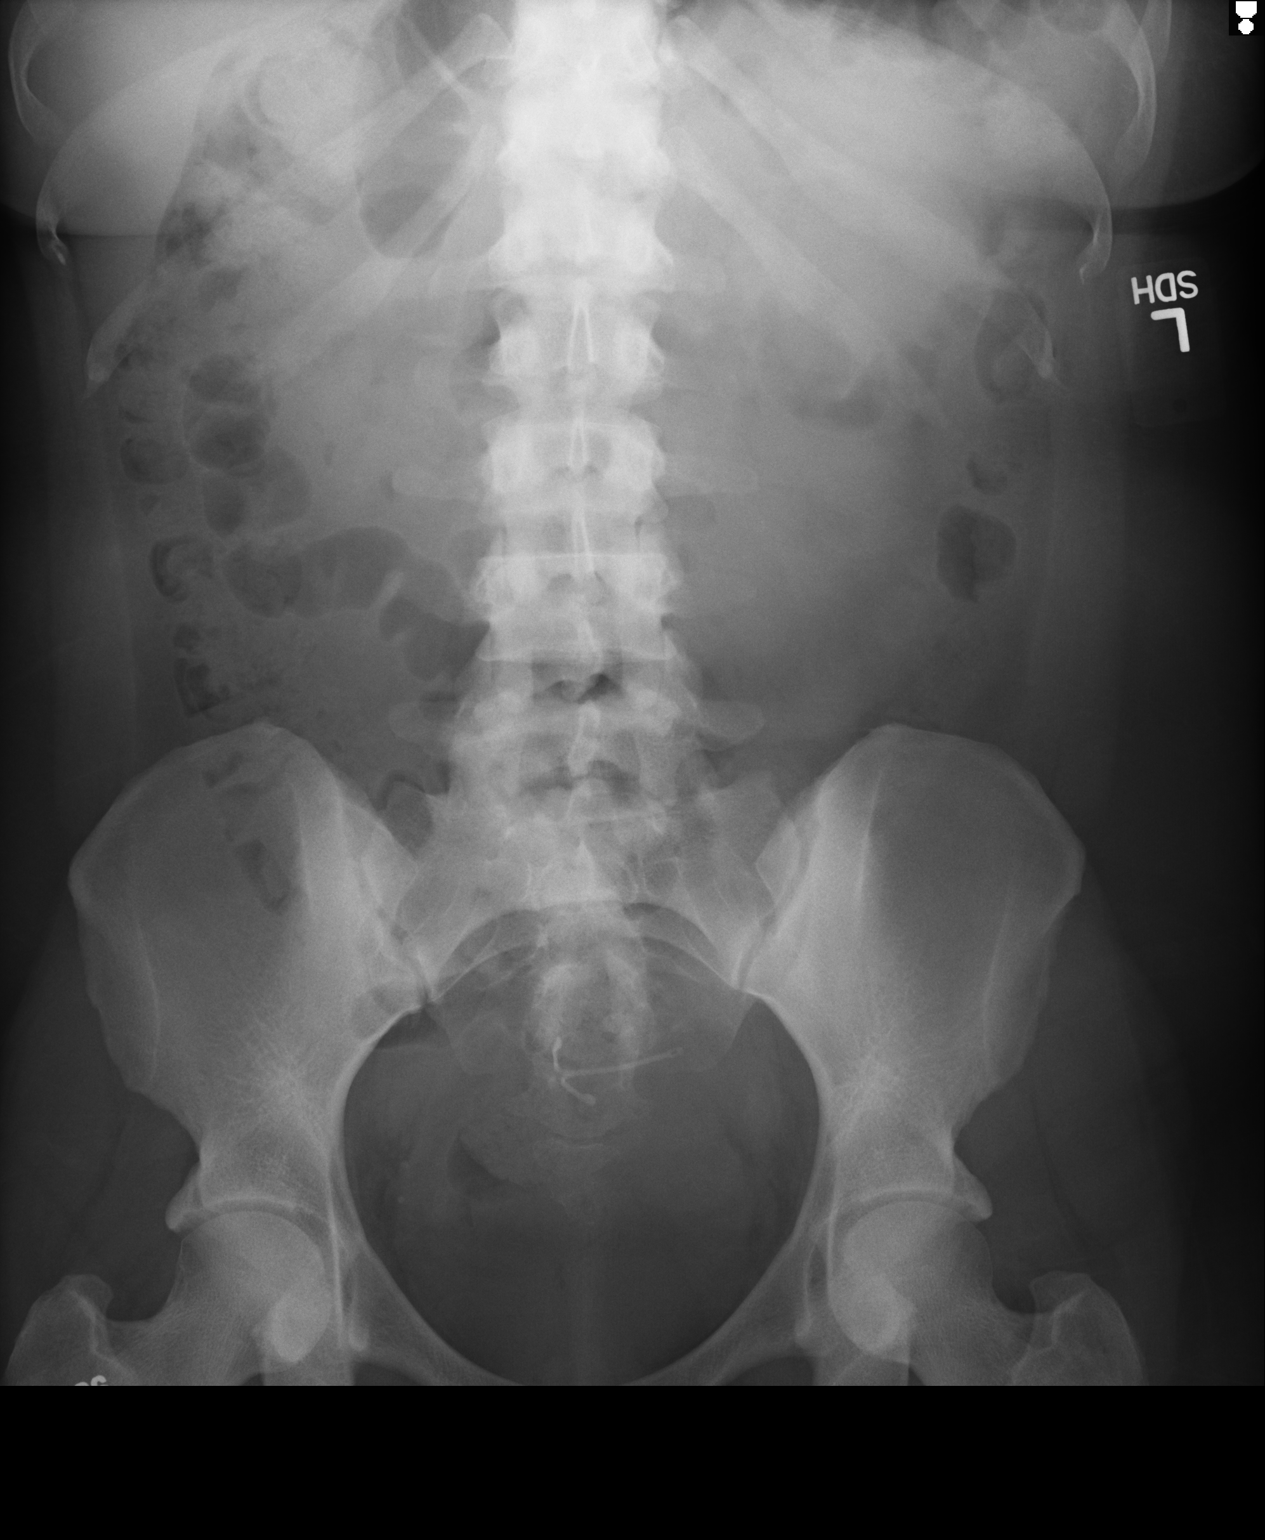

[1 of 1 positions shown; findings below may reference images not displayed]

FINDINGS: Visualized bowel gas pattern is within normal limits. Colonic stool
burden does not appear excessive. No organomegaly or abnormal
calcifications. IUD projects over the anatomic pelvis in unchanged
position compared to prior imaging. Osseous structures are
unremarkable.
IMPRESSION: No evidence of obstruction or excessive colonic stool burden.

IUD remains in unchanged position compared to 04/13/2014.
